# Patient Record
Sex: Female | Born: 1960 | Race: Black or African American | Hispanic: No | Marital: Married
Health system: Southern US, Community
[De-identification: ages and names within clinical notes are randomized; demographics above are authoritative.]

## PROBLEM LIST (undated history)

## (undated) DIAGNOSIS — I10 Essential (primary) hypertension: Secondary | ICD-10-CM

---

## 2018-12-06 ENCOUNTER — Emergency Department (HOSPITAL_COMMUNITY): Payer: Medicaid - Out of State

## 2018-12-06 ENCOUNTER — Encounter (HOSPITAL_COMMUNITY): Payer: Self-pay

## 2018-12-06 ENCOUNTER — Other Ambulatory Visit: Payer: Self-pay

## 2018-12-06 ENCOUNTER — Emergency Department (HOSPITAL_COMMUNITY)
Admission: EM | Admit: 2018-12-06 | Discharge: 2018-12-06 | Disposition: A | Discharge status: Home / Self Care | Payer: Medicaid - Out of State | Attending: Emergency Medicine | Admitting: Emergency Medicine

## 2018-12-06 DIAGNOSIS — R197 Diarrhea, unspecified: Secondary | ICD-10-CM | POA: Insufficient documentation

## 2018-12-06 DIAGNOSIS — Z20828 Contact with and (suspected) exposure to other viral communicable diseases: Secondary | ICD-10-CM | POA: Insufficient documentation

## 2018-12-06 DIAGNOSIS — Z79899 Other long term (current) drug therapy: Secondary | ICD-10-CM | POA: Diagnosis not present

## 2018-12-06 DIAGNOSIS — I10 Essential (primary) hypertension: Secondary | ICD-10-CM | POA: Diagnosis not present

## 2018-12-06 DIAGNOSIS — R109 Unspecified abdominal pain: Secondary | ICD-10-CM | POA: Diagnosis present

## 2018-12-06 DIAGNOSIS — Z20822 Contact with and (suspected) exposure to covid-19: Secondary | ICD-10-CM

## 2018-12-06 HISTORY — DX: Essential (primary) hypertension: I10

## 2018-12-06 LAB — URINALYSIS, ROUTINE W REFLEX MICROSCOPIC
Bilirubin Urine: NEGATIVE
Glucose, UA: NEGATIVE mg/dL
Ketones, ur: NEGATIVE mg/dL
Leukocytes,Ua: NEGATIVE
Nitrite: NEGATIVE
Protein, ur: NEGATIVE mg/dL
Specific Gravity, Urine: 1.01 (ref 1.005–1.030)
pH: 5 (ref 5.0–8.0)

## 2018-12-06 LAB — COMPREHENSIVE METABOLIC PANEL
ALT: 11 U/L (ref 0–44)
AST: 23 U/L (ref 15–41)
Albumin: 4.1 g/dL (ref 3.5–5.0)
Alkaline Phosphatase: 74 U/L (ref 38–126)
Anion gap: 10 (ref 5–15)
BUN: 15 mg/dL (ref 6–20)
CO2: 23 mmol/L (ref 22–32)
Calcium: 9.2 mg/dL (ref 8.9–10.3)
Chloride: 102 mmol/L (ref 98–111)
Creatinine, Ser: 0.72 mg/dL (ref 0.44–1.00)
GFR calc Af Amer: >60 mL/min (ref >60)
GFR calc non Af Amer: >60 mL/min (ref >60)
Glucose, Bld: 113 mg/dL — ABNORMAL HIGH (ref 70–99)
Potassium: 3.9 mmol/L (ref 3.5–5.1)
Sodium: 135 mmol/L (ref 135–145)
Total Bilirubin: 0.8 mg/dL (ref 0.3–1.2)
Total Protein: 8 g/dL (ref 6.5–8.1)

## 2018-12-06 LAB — CBC
HCT: 34.8 % — ABNORMAL LOW (ref 36.0–46.0)
Hemoglobin: 11.3 g/dL — ABNORMAL LOW (ref 12.0–15.0)
MCH: 28.6 pg (ref 26.0–34.0)
MCHC: 32.5 g/dL (ref 30.0–36.0)
MCV: 88.1 fL (ref 80.0–100.0)
Platelets: 335 10*3/uL (ref 150–400)
RBC: 3.95 MIL/uL (ref 3.87–5.11)
RDW: 13.3 % (ref 11.5–15.5)
WBC: 10.4 10*3/uL (ref 4.0–10.5)
nRBC: 0 % (ref 0.0–0.2)

## 2018-12-06 LAB — SARS CORONAVIRUS 2 (TAT 6-24 HRS): SARS Coronavirus 2: NEGATIVE

## 2018-12-06 LAB — TROPONIN I (HIGH SENSITIVITY)
Troponin I (High Sensitivity): <2 ng/L (ref <18)
Troponin I (High Sensitivity): 2 ng/L (ref <18)

## 2018-12-06 MED ORDER — ONDANSETRON HCL 4 MG/2ML IJ SOLN
4.0000 mg | Freq: Once | INTRAMUSCULAR | Status: AC
  Administered 2018-12-06: 4 mg via INTRAVENOUS
  Filled 2018-12-06: qty 2

## 2018-12-06 MED ORDER — SODIUM CHLORIDE (PF) 0.9 % IJ SOLN
INTRAMUSCULAR | Status: AC
  Filled 2018-12-06: qty 50

## 2018-12-06 MED ORDER — IOHEXOL 300 MG/ML  SOLN
100.0000 mL | Freq: Once | INTRAMUSCULAR | Status: AC | PRN
  Administered 2018-12-06: 100 mL via INTRAVENOUS

## 2018-12-06 MED ORDER — DICYCLOMINE HCL 20 MG PO TABS: 20.0000 mg | ORAL_TABLET | Freq: Two times a day (BID) | ORAL | 0 refills | Status: AC

## 2018-12-06 MED ORDER — SODIUM CHLORIDE 0.9% FLUSH
3.0000 mL | Freq: Once | INTRAVENOUS | Status: AC
  Administered 2018-12-06: 3 mL via INTRAVENOUS

## 2018-12-06 MED ORDER — KETOROLAC TROMETHAMINE 30 MG/ML IJ SOLN
30.0000 mg | Freq: Once | INTRAMUSCULAR | Status: AC
  Administered 2018-12-06: 30 mg via INTRAVENOUS
  Filled 2018-12-06: qty 1

## 2018-12-06 MED ORDER — FENTANYL CITRATE (PF) 100 MCG/2ML IJ SOLN
50.0000 ug | Freq: Once | INTRAMUSCULAR | Status: AC
  Administered 2018-12-06: 50 ug via INTRAVENOUS
  Filled 2018-12-06: qty 2

## 2018-12-06 NOTE — ED Provider Notes (Addendum)
[facility]-EMERGENCY DEPT Provider Note   CSN: [id number] Arrival date & time: 12/06/18  0147     History   Chief Complaint Chief Complaint  Patient presents with  . Abdominal Pain    HPI April Hodge is a 58 y.o. female.      Abdominal Pain Pain location:  RLQ and suprapubic Pain quality: cramping   Pain radiates to:  Does not radiate Pain severity:  Severe Onset quality:  Gradual Duration:  1 day Timing:  Constant Progression:  Worsening Chronicity:  New Context: not alcohol use and not awakening from sleep   Relieved by:  Nothing Worsened by:  Nothing Ineffective treatments:  None tried Associated symptoms: chest pain and diarrhea   Associated symptoms: no anorexia, no chills, no constipation, no cough, no dysuria, no hematemesis, no nausea, no shortness of breath, no sore throat and no vomiting   Risk factors: has not had multiple surgeries   Patient with known HTN presents with one day of abdominal pain and 6 episodes of diarrhea this evening and Chest soreness for several hours.    Past Medical History:  Diagnosis Date  . Hypertension     There are no active problems to display for this patient.    OB History   No obstetric history on file.      Home Medications    Prior to Admission medications   Medication Sig Start Date End Date Taking? Authorizing Provider  amLODipine (NORVASC) 5 MG tablet Take 5 mg by mouth daily. 09/28/18  Yes [provider]  Ascorbic Acid (VITAMIN C) 1000 MG tablet Take 1,000 mg by mouth daily.   Yes [provider]  hydrochlorothiazide (HYDRODIURIL) 25 MG tablet Take 25 mg by mouth daily. 09/28/18  Yes [provider]  lisinopril (ZESTRIL) 10 MG tablet Take by mouth. 09/28/18  Yes [provider]  Multiple Vitamins-Minerals (HAIR/SKIN/NAILS/BIOTIN PO) Take 1 tablet by mouth daily.   Yes [provider]  vitamin B-12 (CYANOCOBALAMIN) 500 MCG tablet Take 500  mcg by mouth daily.   Yes [provider]  vitamin E 1000 UNIT capsule Take 1,000 Units by mouth daily.   Yes [provider]  zinc gluconate 50 MG tablet Take 50 mg by mouth daily.   Yes [provider]    Family History No family history on file.  Social History Social History   Tobacco Use  . Smoking status: Not on file  Substance Use Topics  . Alcohol use: Not on file  . Drug use: Not on file     Allergies   Patient has no known allergies.   Review of Systems Review of Systems  Constitutional: Negative for chills.  HENT: Negative for congestion and sore throat.   Eyes: Negative for visual disturbance.  Respiratory: Negative for cough and shortness of breath.   Cardiovascular: Positive for chest pain. Negative for palpitations and leg swelling.  Gastrointestinal: Positive for abdominal pain and diarrhea. Negative for anorexia, constipation, hematemesis, nausea and vomiting.  Genitourinary: Negative for dysuria.  Neurological: Negative for dizziness.  Psychiatric/Behavioral: Negative for agitation.  All other systems reviewed and are negative.    Physical Exam Updated Vital Signs BP 120/68   Pulse 90   Temp 100 F (37.8 C) (Oral)   Resp 18   SpO2 100%   Physical Exam Vitals signs reviewed.  Constitutional:      General: She is not in acute distress.    Appearance: Normal appearance.  HENT:  Head: Normocephalic and atraumatic.     Nose: Nose normal.  Eyes:     Conjunctiva/sclera: Conjunctivae normal.     Pupils: Pupils are equal, round, and reactive to light.  Neck:     Musculoskeletal: Neck supple.  Cardiovascular:     Rate and Rhythm: Normal rate and regular rhythm.     Pulses: Normal pulses.     Heart sounds: Normal heart sounds.  Pulmonary:     Effort: Pulmonary effort is normal.     Breath sounds: Normal breath sounds.  Abdominal:     General: Abdomen is flat. Bowel sounds are normal.     Tenderness: There is  abdominal tenderness. There is no guarding or rebound.  Musculoskeletal: Normal range of motion.  Skin:    General: Skin is warm and dry.     Capillary Refill: Capillary refill takes less than 2 seconds.  Neurological:     General: No focal deficit present.     Mental Status: She is alert and oriented to person, place, and time.     Deep Tendon Reflexes: Reflexes normal.  Psychiatric:        Mood and Affect: Mood normal.        Behavior: Behavior normal.      ED Treatments / Results  Labs (all labs ordered are listed, but only abnormal results are displayed) Results for orders placed or performed during the hospital encounter of 12/06/18  Comprehensive metabolic panel  Result Value Ref Range   Sodium 135 135 - 145 mmol/L   Potassium 3.9 3.5 - 5.1 mmol/L   Chloride 102 98 - 111 mmol/L   CO2 23 22 - 32 mmol/L   Glucose, Bld 113 (H) 70 - 99 mg/dL   BUN 15 6 - 20 mg/dL   Creatinine, Ser 0.72 0.44 - 1.00 mg/dL   Calcium 9.2 8.9 - 10.3 mg/dL   Total Protein 8.0 6.5 - 8.1 g/dL   Albumin 4.1 3.5 - 5.0 g/dL   AST 23 15 - 41 U/L   ALT 11 0 - 44 U/L   Alkaline Phosphatase 74 38 - 126 U/L   Total Bilirubin 0.8 0.3 - 1.2 mg/dL   GFR calc non Af Amer >60 >60 mL/min   GFR calc Af Amer >60 >60 mL/min   Anion gap 10 5 - 15  Urinalysis, Routine w reflex microscopic  Result Value Ref Range   Color, Urine STRAW (A) YELLOW   APPearance CLEAR CLEAR   Specific Gravity, Urine 1.010 1.005 - 1.030   pH 5.0 5.0 - 8.0   Glucose, UA NEGATIVE NEGATIVE mg/dL   Hgb urine dipstick SMALL (A) NEGATIVE   Bilirubin Urine NEGATIVE NEGATIVE   Ketones, ur NEGATIVE NEGATIVE mg/dL   Protein, ur NEGATIVE NEGATIVE mg/dL   Nitrite NEGATIVE NEGATIVE   Leukocytes,Ua NEGATIVE NEGATIVE   RBC / HPF 0-5 0 - 5 RBC/hpf   WBC, UA 0-5 0 - 5 WBC/hpf   Bacteria, UA RARE (A) NONE SEEN   Squamous Epithelial / LPF 0-5 0 - 5   Mucus PRESENT   CBC  Result Value Ref Range   WBC 10.4 4.0 - 10.5 K/uL   RBC 3.95 3.87 -  5.11 MIL/uL   Hemoglobin 11.3 (L) 12.0 - 15.0 g/dL   HCT 34.8 (L) 36.0 - 46.0 %   MCV 88.1 80.0 - 100.0 fL   MCH 28.6 26.0 - 34.0 pg   MCHC 32.5 30.0 - 36.0 g/dL   RDW 13.3 11.5 - 15.5 %  Platelets 335 150 - 400 K/uL   nRBC 0.0 0.0 - 0.2 %  Troponin I (High Sensitivity)  Result Value Ref Range   Troponin I (High Sensitivity) <2.00 <18 ng/L   Dg Chest Portable 1 View  Result Date: 12/06/2018 CLINICAL DATA:  Nausea, vomiting, chest pain EXAM: PORTABLE CHEST 1 VIEW COMPARISON:  None. FINDINGS: Heart is upper limits normal in size. No confluent opacities, effusions or edema. No acute bony abnormality. IMPRESSION: No active disease. Electronically Signed   By: Charlett NoseKevin  Dover M.D.   On: 12/06/2018 02:34    EKG EKG Interpretation  Date/Time:  Thursday December 06 2018 02:06:29 EST Ventricular Rate:  86 PR Interval:    QRS Duration: 66 QT Interval:  450 QTC Calculation: 539 R Axis:   0 Text Interpretation: Sinus rhythm Consider anterior infarct Prolonged QT interval Confirmed by Aquila Delaughter ([id number]) on 12/06/2018 2:10:41 AM   Radiology Dg Chest Portable 1 View  Result Date: 12/06/2018 CLINICAL DATA:  Nausea, vomiting, chest pain EXAM: PORTABLE CHEST 1 VIEW COMPARISON:  None. FINDINGS: Heart is upper limits normal in size. No confluent opacities, effusions or edema. No acute bony abnormality. IMPRESSION: No active disease. Electronically Signed   By: Charlett NoseKevin  Dover M.D.   On: 12/06/2018 02:34    Procedures Procedures (including critical care time)  Medications Ordered in ED Medications  sodium chloride (PF) 0.9 % injection (has no administration in time range)  ketorolac (TORADOL) 30 MG/ML injection 30 mg (has no administration in time range)  sodium chloride flush (NS) 0.9 % injection 3 mL (3 mLs Intravenous Given 12/06/18 0446)  fentaNYL (SUBLIMAZE) injection 50 mcg (50 mcg Intravenous Given 12/06/18 0444)  ondansetron (ZOFRAN) injection 4 mg (4 mg Intravenous Given 12/06/18 0444)   iohexol (OMNIPAQUE) 300 MG/ML solution 100 mL (100 mLs Intravenous Contrast Given 12/06/18 0549)   HEART score is 2 low risk for MACE.  Ruled out for MI in the ED.   Initial Impression / Assessment and Plan / ED Course  CT without acute finding.  Patient had cramping likely from diarrhea itself which is likely infectious in nature. Given presentation COVID is a concern.  Will send outpatient covid swab.  Have spoken to patient at length about home isolation and not leaving the home pending results of COVID.   Will place patient in home isolation pending results.  Bland diet for 2 days.  Follow up with your family doctor.  Patient verbalizes understanding and agrees to follow up.    Scheryl Martenhelma Speros was evaluated in Emergency Department on 12/06/2018 for the symptoms described in the history of present illness. She was evaluated in the context of the global COVID-19 pandemic, which necessitated consideration that the patient might be at risk for infection with the SARS-CoV-2 virus that causes COVID-19. Institutional protocols and algorithms that pertain to the evaluation of patients at risk for COVID-19 are in a state of rapid change based on information released by regulatory bodies including the CDC and federal and state organizations. These policies and algorithms were followed during the patient's care in the ED.    Final Clinical Impressions(s) / ED Diagnoses   Return for intractable cough, coughing up blood,fevers >100.4 unrelieved by medication, shortness of breath, intractable vomiting, chest pain, shortness of breath, weakness,numbness, changes in speech, facial asymmetry,abdominal pain, passing out,Inability to tolerate liquids or food, cough, altered mental status or any concerns. No signs of systemic illness or infection. The patient is nontoxic-appearing on exam and vital signs are within normal limits.  I have reviewed the triage vital signs and the nursing notes. Pertinent labs  &imaging results that were available during my care of the patient were reviewed by me and considered in my medical decision making (see chart for details).  After history, exam, and medical workup I feel the patient has been appropriately medically screened and is safe for discharge home. Pertinent diagnoses were discussed with the patient. Patient was given return precautions   Rhys Anchondo, MD 12/06/18 [phone]

## 2018-12-06 NOTE — Discharge Instructions (Signed)
Person Under Monitoring Name: April Hodge  Location: [address] [place] [address]   Infection Prevention Recommendations for Individuals Confirmed to have, or Being Evaluated for, 2019 Novel Coronavirus (COVID-19) Infection Who Receive Care at Home  Individuals who are confirmed to have, or are being evaluated for, COVID-19 should follow the prevention steps below until a healthcare provider or local or state health department says they can return to normal activities.  Stay home except to get medical care You should restrict activities outside your home, except for getting medical care. Do not go to work, school, or public areas, and do not use public transportation or taxis.  Call ahead before visiting your doctor Before your medical appointment, call the healthcare provider and tell them that you have, or are being evaluated for, COVID-19 infection. This will help the healthcare providers office take steps to keep other people from getting infected. Ask your healthcare provider to call the local or state health department.  Monitor your symptoms Seek prompt medical attention if your illness is worsening (e.g., difficulty breathing). Before going to your medical appointment, call the healthcare provider and tell them that you have, or are being evaluated for, COVID-19 infection. Ask your healthcare provider to call the local or state health department.  Wear a facemask You should wear a facemask that covers your nose and mouth when you are in the same room with other people and when you visit a healthcare provider. People who live with or visit you should also wear a facemask while they are in the same room with you.  Separate yourself from other people in your home As much as possible, you should stay in a different room from other people in your home. Also, you should use a separate bathroom, if available.  Avoid sharing household items You should  not share dishes, drinking glasses, cups, eating utensils, towels, bedding, or other items with other people in your home. After using these items, you should wash them thoroughly with soap and water.  Cover your coughs and sneezes Cover your mouth and nose with a tissue when you cough or sneeze, or you can cough or sneeze into your sleeve. Throw used tissues in a lined trash can, and immediately wash your hands with soap and water for at least 20 seconds or use an alcohol-based hand rub.  Wash your [facility] your hands often and thoroughly with soap and water for at least 20 seconds. You can use an alcohol-based hand sanitizer if soap and water are not available and if your hands are not visibly dirty. Avoid touching your eyes, nose, and mouth with unwashed hands.   Prevention Steps for Caregivers and Household Members of Individuals Confirmed to have, or Being Evaluated for, COVID-19 Infection Being Cared for in the Home  If you live with, or provide care at home for, a person confirmed to have, or being evaluated for, COVID-19 infection please follow these guidelines to prevent infection:  Follow healthcare providers instructions Make sure that you understand and can help the patient follow any healthcare provider instructions for all care.  Provide for the patients basic needs You should help the patient with basic needs in the home and provide support for getting groceries, prescriptions, and other personal needs.  Monitor the patients symptoms If they are getting sicker, call his or her medical provider and tell them that the patient has, or is being evaluated for, COVID-19 infection. This will help the healthcare  providers office take steps to keep other people from getting infected. Ask the healthcare provider to call the local or state health department.  Limit the number of people who have contact with the patient If possible, have only one caregiver for the  patient. Other household members should stay in another home or place of residence. If this is not possible, they should stay in another room, or be separated from the patient as much as possible. Use a separate bathroom, if available. Restrict visitors who do not have an essential need to be in the home.  Keep older adults, very young children, and other sick people away from the patient Keep older adults, very young children, and those who have compromised immune systems or chronic health conditions away from the patient. This includes people with chronic heart, lung, or kidney conditions, diabetes, and cancer.  Ensure good ventilation Make sure that shared spaces in the home have good air flow, such as from an air conditioner or an opened window, weather permitting.  Wash your hands often Wash your hands often and thoroughly with soap and water for at least 20 seconds. You can use an alcohol based hand sanitizer if soap and water are not available and if your hands are not visibly dirty. Avoid touching your eyes, nose, and mouth with unwashed hands. Use disposable paper towels to dry your hands. If not available, use dedicated cloth towels and replace them when they become wet.  Wear a facemask and gloves Wear a disposable facemask at all times in the room and gloves when you touch or have contact with the patients blood, body fluids, and/or secretions or excretions, such as sweat, saliva, sputum, nasal mucus, vomit, urine, or feces.  Ensure the mask fits over your nose and mouth tightly, and do not touch it during use. Throw out disposable facemasks and gloves after using them. Do not reuse. Wash your hands immediately after removing your facemask and gloves. If your personal clothing becomes contaminated, carefully remove clothing and launder. Wash your hands after handling contaminated clothing. Place all used disposable facemasks, gloves, and other waste in a lined container before  disposing them with other household waste. Remove gloves and wash your hands immediately after handling these items.  Do not share dishes, glasses, or other household items with the patient Avoid sharing household items. You should not share dishes, drinking glasses, cups, eating utensils, towels, bedding, or other items with a patient who is confirmed to have, or being evaluated for, COVID-19 infection. After the person uses these items, you should wash them thoroughly with soap and water.  Wash laundry thoroughly Immediately remove and wash clothes or bedding that have blood, body fluids, and/or secretions or excretions, such as sweat, saliva, sputum, nasal mucus, vomit, urine, or feces, on them. Wear gloves when handling laundry from the patient. Read and follow directions on labels of laundry or clothing items and detergent. In general, wash and dry with the warmest temperatures recommended on the label.  Clean all areas the individual has used often Clean all touchable surfaces, such as counters, tabletops, doorknobs, bathroom fixtures, toilets, phones, keyboards, tablets, and bedside tables, every day. Also, clean any surfaces that may have blood, body fluids, and/or secretions or excretions on them. Wear gloves when cleaning surfaces the patient has come in contact with. Use a diluted bleach solution (e.g., dilute bleach with 1 part bleach and 10 parts water) or a household disinfectant with a label that says EPA-registered for coronaviruses. To make  a bleach solution at home, add 1 tablespoon of bleach to 1 quart (4 cups) of water. For a larger supply, add  cup of bleach to 1 gallon (16 cups) of water. Read labels of cleaning products and follow recommendations provided on product labels. Labels contain instructions for safe and effective use of the cleaning product including precautions you should take when applying the product, such as wearing gloves or eye protection and making sure you  have good ventilation during use of the product. Remove gloves and wash hands immediately after cleaning.  Monitor yourself for signs and symptoms of illness Caregivers and household members are considered close contacts, should monitor their health, and will be asked to limit movement outside of the home to the extent possible. Follow the monitoring steps for close contacts listed on the symptom monitoring form.   ? If you have additional questions, contact your local health department or call the epidemiologist on call at [phone] (available 24/7). ? This guidance is subject to change. For the most up-to-date guidance from [facility], please refer to their website: [email or web address]

## 2018-12-06 NOTE — ED Notes (Signed)
Pt to CT

## 2018-12-06 NOTE — ED Triage Notes (Signed)
Patient arrived with complaints of right lower abdominal pain and diarrhea that started a couple hours ago. States when she woke up she had chest wall pain.

## 2021-07-06 IMAGING — CT CT ABD-PELV W/ CM
2 of 5 series · 16 of 46 positions shown, 18 images · IV contrast (omnipaque)
Comparison: None.

CLINICAL DATA: Right-sided abdominal pain and diarrhea.

EXAM:
CT ABDOMEN AND PELVIS WITH CONTRAST
TECHNIQUE: Multidetector CT imaging of the abdomen and pelvis was performed
using the standard protocol following bolus administration of
intravenous contrast.
CONTRAST:  100mL OMNIPAQUE IOHEXOL 300 MG/ML  SOLN

[Series 2: axial st · axial · 0.68mm/px · z∈[+1216,+1566]mm · 13 of 80 slices shown, 15 images]
[im 5/80  soft-tissue]
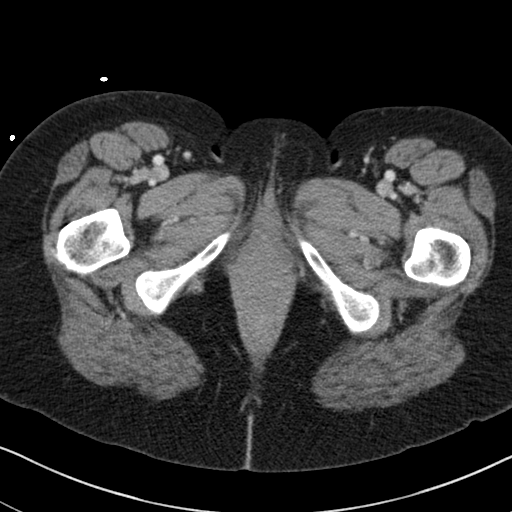
[im 5/80  bone]
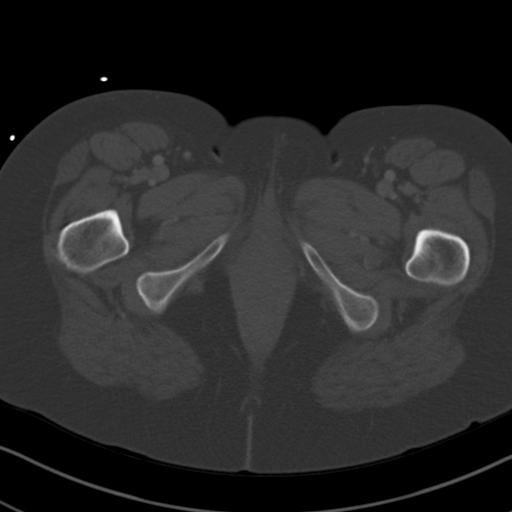
[im 9/80  soft-tissue]
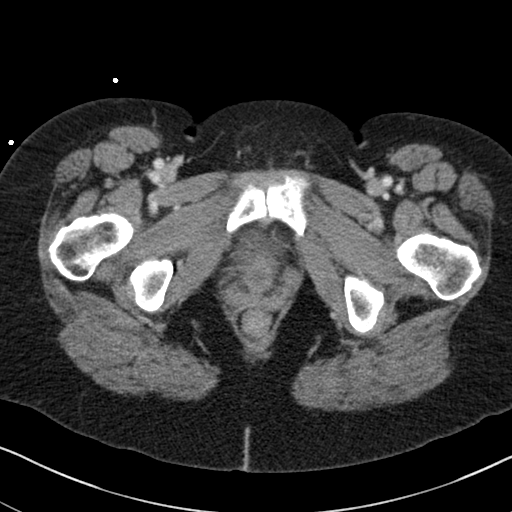
[im 18/80  soft-tissue]
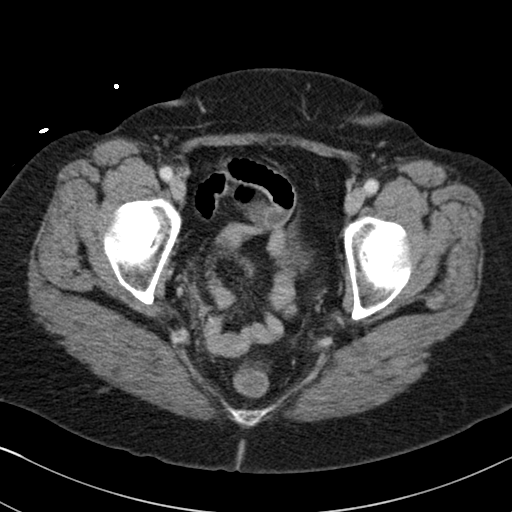
[im 22/80  soft-tissue]
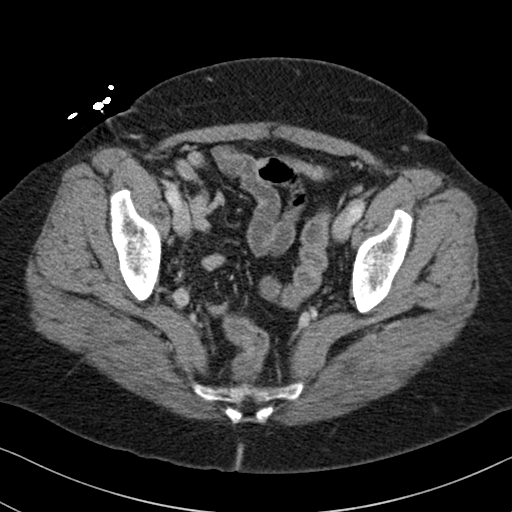
[im 27/80  soft-tissue]
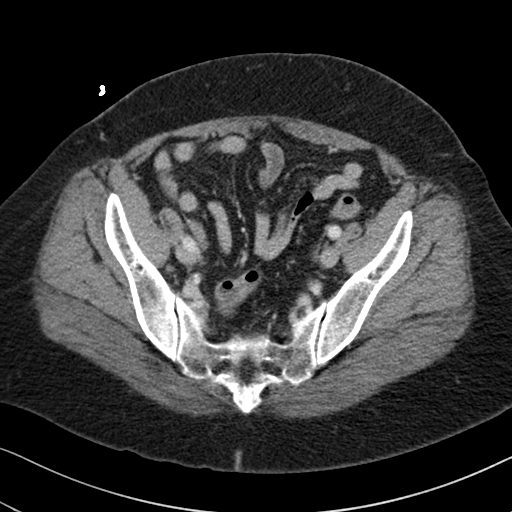
[im 36/80  soft-tissue]
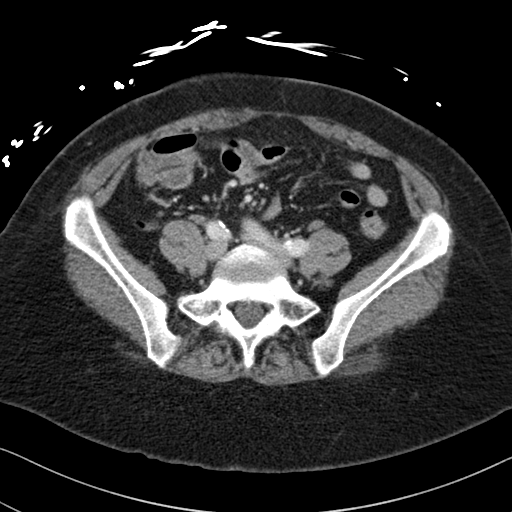
[im 40/80  soft-tissue]
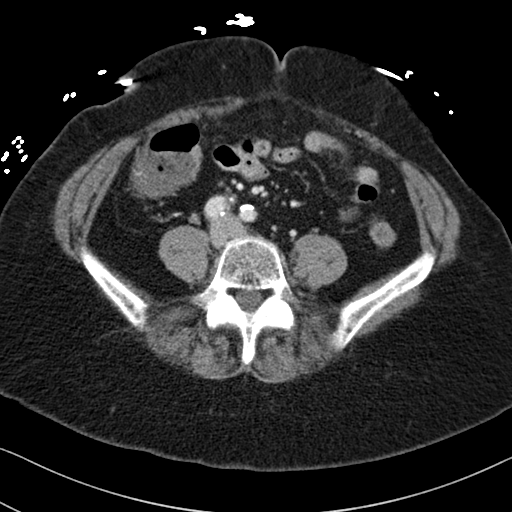
[im 44/80  soft-tissue]
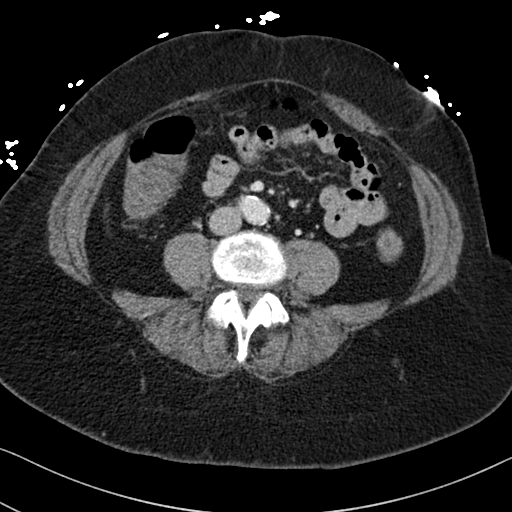
[im 53/80  soft-tissue]
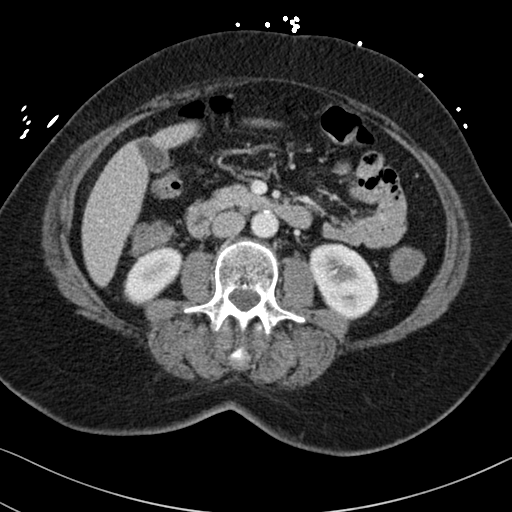
[im 53/80  bone]
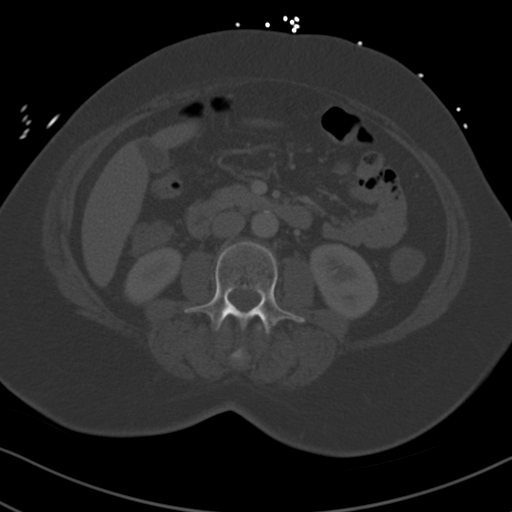
[im 58/80  soft-tissue]
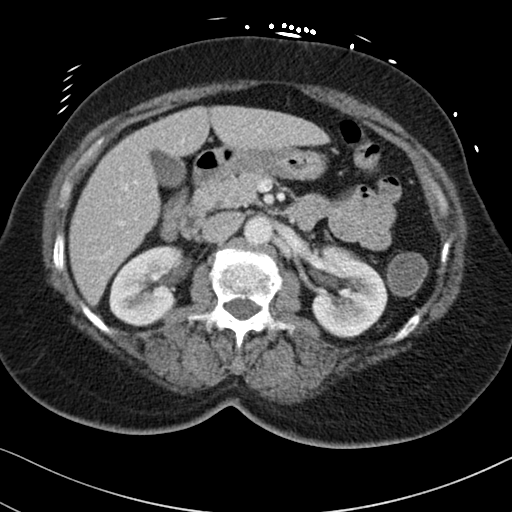
[im 62/80  soft-tissue]
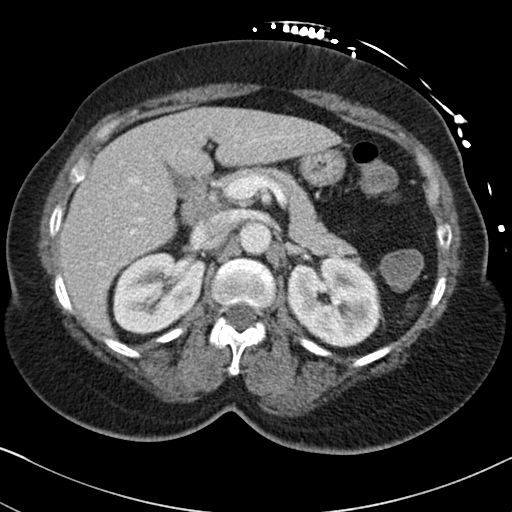
[im 71/80  soft-tissue]
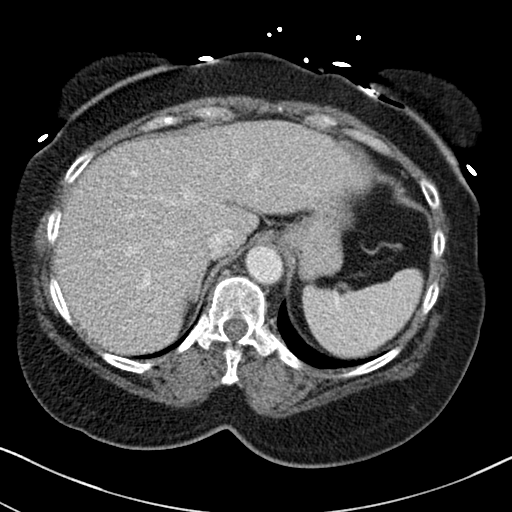
[im 75/80  soft-tissue]
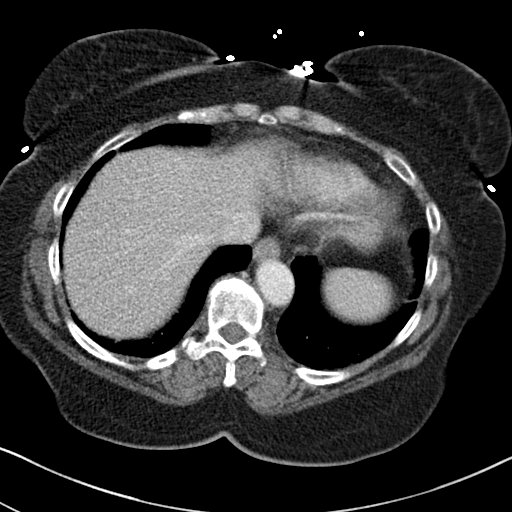

[Series 4: coronal st · coronal · 0.69mm/px · 3 of 151 slices shown]
[im 51/151  soft-tissue]
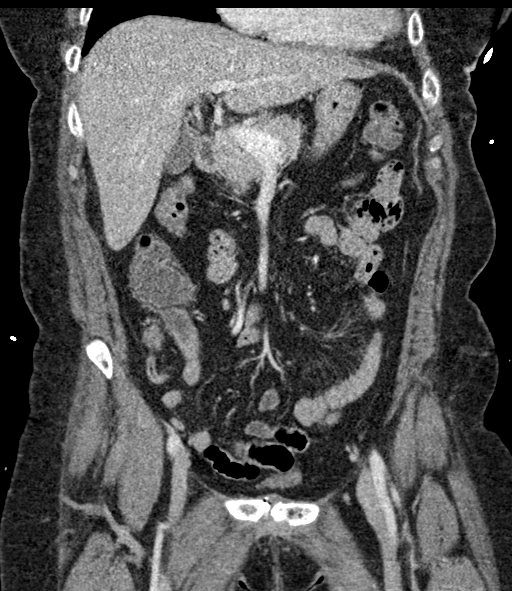
[im 67/151  soft-tissue]
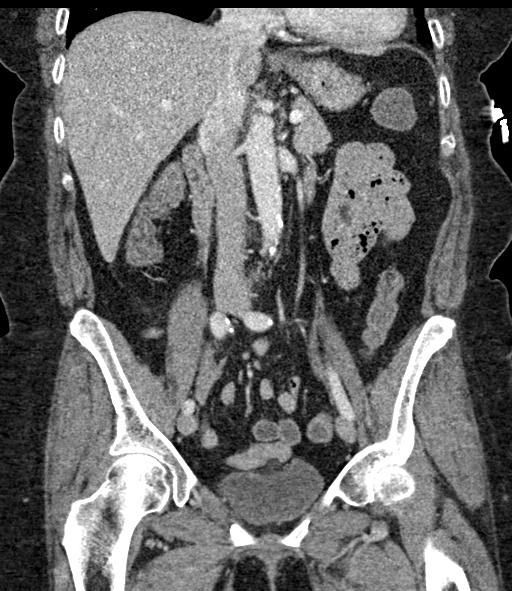
[im 84/151  soft-tissue]
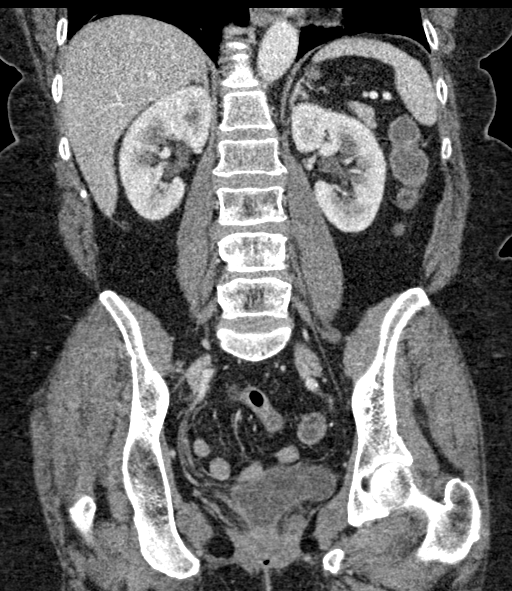

[16 of 46 positions shown; findings below may reference images not displayed]

FINDINGS: Lower chest: The lung bases are clear of acute process. No pleural
effusion or pulmonary lesions. The heart is normal in size. No
pericardial effusion. The distal esophagus and aorta are
unremarkable.

Hepatobiliary: No focal hepatic lesions or intrahepatic biliary
dilatation. The gallbladder appears normal. The portal and hepatic
veins are patent. No common bile duct dilatation.

Pancreas: No mass, inflammation or ductal dilatation.

Spleen: Normal size.  No focal lesions.

Adrenals/Urinary Tract: The adrenal glands and kidneys are
unremarkable. No renal, ureteral or bladder calculi or mass.

Stomach/Bowel: The stomach, duodenum, small bowel and colon are
unremarkable. No acute inflammatory changes, mass lesions or
obstructive findings identified without oral contrast. The terminal
ileum is normal. The appendix is normal.

Vascular/Lymphatic: Age advanced atherosclerotic calcifications
involving the distal aorta and iliac arteries but no aneurysm or
dissection. The branch vessels are patent. The major venous
structures are patent.

No mesenteric or retroperitoneal mass or lymphadenopathy.

Reproductive: The uterus is surgically absent. Both ovaries are
still present and appear normal.

Other: No pelvic mass or adenopathy. No free pelvic fluid
collections. No inguinal mass or adenopathy.

Musculoskeletal: No significant bony findings.
IMPRESSION: 1. No acute abdominal/pelvic findings, mass lesions or adenopathy.
2. Age advanced vascular calcifications.
3. Status post hysterectomy. Both ovaries are still present and
appear normal.
